# Patient Record
Sex: Male | Born: 1935 | Race: White | Hispanic: No | Marital: Married
Health system: Southern US, Community
[De-identification: ages and names within clinical notes are randomized; demographics above are authoritative.]

---

## 2011-06-16 ENCOUNTER — Ambulatory Visit: Payer: Self-pay | Admitting: Internal Medicine

## 2011-06-25 ENCOUNTER — Inpatient Hospital Stay: Payer: Self-pay | Admitting: Specialist

## 2011-06-25 LAB — COMPREHENSIVE METABOLIC PANEL
Albumin: 4.2 g/dL (ref 3.4–5.0)
Alkaline Phosphatase: 151 U/L — ABNORMAL HIGH (ref 50–136)
Anion Gap: 10 (ref 7–16)
BUN: 43 mg/dL — ABNORMAL HIGH (ref 7–18)
Calcium, Total: 8.9 mg/dL (ref 8.5–10.1)
Creatinine: 1.44 mg/dL — ABNORMAL HIGH (ref 0.60–1.30)
EGFR (African American): >60
Glucose: 120 mg/dL — ABNORMAL HIGH (ref 65–99)
Osmolality: 288 (ref 275–301)
Potassium: 4.1 mmol/L (ref 3.5–5.1)
SGOT(AST): 81 U/L — ABNORMAL HIGH (ref 15–37)
Sodium: 138 mmol/L (ref 136–145)

## 2011-06-25 LAB — APTT
Activated PTT: >160 secs (ref 23.6–35.9)
Activated PTT: 108.2 secs — ABNORMAL HIGH (ref 23.6–35.9)

## 2011-06-25 LAB — CK TOTAL AND CKMB (NOT AT ARMC)
CK, Total: 327 U/L — ABNORMAL HIGH (ref 35–232)
CK, Total: 554 U/L — ABNORMAL HIGH (ref 35–232)
CK-MB: 42.5 ng/mL — ABNORMAL HIGH (ref 0.5–3.6)

## 2011-06-25 LAB — CBC
HCT: 41.8 % (ref 40.0–52.0)
HGB: 14.2 g/dL (ref 13.0–18.0)
MCH: 30.7 pg (ref 26.0–34.0)
Platelet: 288 10*3/uL (ref 150–440)
RBC: 4.61 10*6/uL (ref 4.40–5.90)

## 2011-06-26 LAB — CBC WITH DIFFERENTIAL/PLATELET
Basophil #: 0 10*3/uL (ref 0.0–0.1)
Basophil %: 0 %
Eosinophil #: 0 10*3/uL (ref 0.0–0.7)
Lymphocyte #: 0.7 10*3/uL — ABNORMAL LOW (ref 1.0–3.6)
Lymphocyte %: 7.6 %
MCH: 30.1 pg (ref 26.0–34.0)
MCHC: 33.1 g/dL (ref 32.0–36.0)
MCV: 91 fL (ref 80–100)
Monocyte #: 0.1 x10 3/mm — ABNORMAL LOW (ref 0.2–1.0)
Neutrophil #: 8.9 10*3/uL — ABNORMAL HIGH (ref 1.4–6.5)
Neutrophil %: 91.4 %
RBC: 3.76 10*6/uL — ABNORMAL LOW (ref 4.40–5.90)
RDW: 14.1 % (ref 11.5–14.5)
WBC: 9.7 10*3/uL (ref 3.8–10.6)

## 2011-06-26 LAB — URINALYSIS, COMPLETE
Bilirubin,UR: NEGATIVE
Glucose,UR: NEGATIVE mg/dL (ref 0–75)
Hyaline Cast: 3
Ketone: NEGATIVE
Nitrite: NEGATIVE
Ph: 6 (ref 4.5–8.0)
Protein: 30
RBC,UR: 20 /HPF (ref 0–5)
Specific Gravity: 1.015 (ref 1.003–1.030)
WBC UR: 10 /HPF (ref 0–5)

## 2011-06-26 LAB — BASIC METABOLIC PANEL
BUN: 37 mg/dL — ABNORMAL HIGH (ref 7–18)
Chloride: 99 mmol/L (ref 98–107)
Co2: 30 mmol/L (ref 21–32)
EGFR (African American): >60
EGFR (Non-African Amer.): >60
Glucose: 150 mg/dL — ABNORMAL HIGH (ref 65–99)
Potassium: 3.6 mmol/L (ref 3.5–5.1)

## 2011-06-26 LAB — HEMOGLOBIN A1C: Hemoglobin A1C: 5.6 % (ref 4.2–6.3)

## 2011-06-26 LAB — CK TOTAL AND CKMB (NOT AT ARMC): CK-MB: 75.1 ng/mL — ABNORMAL HIGH (ref 0.5–3.6)

## 2011-06-26 LAB — APTT: Activated PTT: 97.8 secs — ABNORMAL HIGH (ref 23.6–35.9)

## 2011-06-27 LAB — APTT: Activated PTT: 154.8 secs — ABNORMAL HIGH (ref 23.6–35.9)

## 2011-06-29 LAB — BASIC METABOLIC PANEL
Calcium, Total: 8.2 mg/dL — ABNORMAL LOW (ref 8.5–10.1)
Chloride: 98 mmol/L (ref 98–107)
Co2: 36 mmol/L — ABNORMAL HIGH (ref 21–32)
EGFR (African American): >60
EGFR (Non-African Amer.): >60
Glucose: 176 mg/dL — ABNORMAL HIGH (ref 65–99)
Osmolality: 291 (ref 275–301)
Potassium: 3.8 mmol/L (ref 3.5–5.1)
Sodium: 141 mmol/L (ref 136–145)

## 2011-06-29 LAB — CBC WITH DIFFERENTIAL/PLATELET
Basophil #: 0 10*3/uL (ref 0.0–0.1)
Eosinophil #: 0 10*3/uL (ref 0.0–0.7)
Eosinophil %: 0 %
HCT: 32.7 % — ABNORMAL LOW (ref 40.0–52.0)
Lymphocyte %: 4.5 %
MCHC: 34.1 g/dL (ref 32.0–36.0)
RDW: 13.6 % (ref 11.5–14.5)
WBC: 7.7 10*3/uL (ref 3.8–10.6)

## 2011-06-30 LAB — BASIC METABOLIC PANEL
Calcium, Total: 8.3 mg/dL — ABNORMAL LOW (ref 8.5–10.1)
Creatinine: 0.96 mg/dL (ref 0.60–1.30)
Glucose: 166 mg/dL — ABNORMAL HIGH (ref 65–99)
Osmolality: 289 (ref 275–301)
Potassium: 4.5 mmol/L (ref 3.5–5.1)
Sodium: 140 mmol/L (ref 136–145)

## 2011-06-30 LAB — CBC WITH DIFFERENTIAL/PLATELET
Basophil #: 0 10*3/uL (ref 0.0–0.1)
Basophil %: 0.1 %
HCT: 34 % — ABNORMAL LOW (ref 40.0–52.0)
HGB: 11.7 g/dL — ABNORMAL LOW (ref 13.0–18.0)
MCH: 30.7 pg (ref 26.0–34.0)
MCHC: 34.4 g/dL (ref 32.0–36.0)
Neutrophil #: 8 10*3/uL — ABNORMAL HIGH (ref 1.4–6.5)
Platelet: 161 10*3/uL (ref 150–440)
RBC: 3.8 10*6/uL — ABNORMAL LOW (ref 4.40–5.90)
RDW: 13.7 % (ref 11.5–14.5)

## 2011-06-30 LAB — CULTURE, BLOOD (SINGLE)

## 2011-07-01 LAB — MAGNESIUM: Magnesium: 2.2 mg/dL

## 2011-07-02 LAB — BASIC METABOLIC PANEL
BUN: 33 mg/dL — ABNORMAL HIGH (ref 7–18)
Calcium, Total: 8.1 mg/dL — ABNORMAL LOW (ref 8.5–10.1)
Chloride: 99 mmol/L (ref 98–107)
Co2: 31 mmol/L (ref 21–32)
Creatinine: 1.06 mg/dL (ref 0.60–1.30)
EGFR (African American): >60

## 2011-07-16 ENCOUNTER — Ambulatory Visit: Payer: Self-pay | Admitting: Internal Medicine

## 2011-07-27 LAB — URINALYSIS, COMPLETE
Bacteria: NONE SEEN
Bilirubin,UR: NEGATIVE
Leukocyte Esterase: NEGATIVE
Nitrite: NEGATIVE
Ph: 5 (ref 4.5–8.0)
RBC,UR: 1 /HPF (ref 0–5)

## 2011-07-27 LAB — CBC
HGB: 11.9 g/dL — ABNORMAL LOW (ref 13.0–18.0)
MCV: 93 fL (ref 80–100)
Platelet: 253 10*3/uL (ref 150–440)
RDW: 15.5 % — ABNORMAL HIGH (ref 11.5–14.5)

## 2011-07-27 LAB — COMPREHENSIVE METABOLIC PANEL
Albumin: 3 g/dL — ABNORMAL LOW (ref 3.4–5.0)
Alkaline Phosphatase: 128 U/L (ref 50–136)
Anion Gap: 8 (ref 7–16)
Calcium, Total: 8.8 mg/dL (ref 8.5–10.1)
Chloride: 103 mmol/L (ref 98–107)
Creatinine: 1.88 mg/dL — ABNORMAL HIGH (ref 0.60–1.30)
EGFR (African American): 38 — ABNORMAL LOW
Osmolality: 294 (ref 275–301)
Potassium: 5 mmol/L (ref 3.5–5.1)
SGOT(AST): 84 U/L — ABNORMAL HIGH (ref 15–37)
Total Protein: 7 g/dL (ref 6.4–8.2)

## 2011-07-27 LAB — TROPONIN I
Troponin-I: <0.02 ng/mL
Troponin-I: <0.02 ng/mL

## 2011-07-27 LAB — ETHANOL
Ethanol %: <0.003 % (ref 0.000–0.080)
Ethanol: <3 mg/dL

## 2011-07-27 LAB — DRUG SCREEN, URINE
Barbiturates, Ur Screen: NEGATIVE (ref ?–200)
Benzodiazepine, Ur Scrn: NEGATIVE (ref ?–200)
Cocaine Metabolite,Ur ~~LOC~~: NEGATIVE (ref ?–300)
MDMA (Ecstasy)Ur Screen: NEGATIVE (ref ?–500)
Phencyclidine (PCP) Ur S: NEGATIVE (ref ?–25)

## 2011-07-27 LAB — CK TOTAL AND CKMB (NOT AT ARMC): CK, Total: 1270 U/L — ABNORMAL HIGH (ref 35–232)

## 2011-07-27 LAB — PROTIME-INR
INR: 1
Prothrombin Time: 13.7 secs (ref 11.5–14.7)

## 2011-07-28 ENCOUNTER — Inpatient Hospital Stay: Payer: Self-pay | Admitting: Specialist

## 2011-07-28 LAB — BASIC METABOLIC PANEL
Anion Gap: 6 — ABNORMAL LOW (ref 7–16)
Co2: 29 mmol/L (ref 21–32)
Creatinine: 1.39 mg/dL — ABNORMAL HIGH (ref 0.60–1.30)
EGFR (Non-African Amer.): 48 — ABNORMAL LOW
Glucose: 131 mg/dL — ABNORMAL HIGH (ref 65–99)

## 2011-07-28 LAB — CK TOTAL AND CKMB (NOT AT ARMC): CK, Total: 753 U/L — ABNORMAL HIGH (ref 35–232)

## 2011-07-28 LAB — TROPONIN I: Troponin-I: <0.02 ng/mL

## 2011-07-29 LAB — BASIC METABOLIC PANEL
Anion Gap: 7 (ref 7–16)
BUN: 30 mg/dL — ABNORMAL HIGH (ref 7–18)
Calcium, Total: 8.3 mg/dL — ABNORMAL LOW (ref 8.5–10.1)
Chloride: 108 mmol/L — ABNORMAL HIGH (ref 98–107)
Glucose: 100 mg/dL — ABNORMAL HIGH (ref 65–99)
Potassium: 4.2 mmol/L (ref 3.5–5.1)

## 2011-07-29 LAB — TSH: Thyroid Stimulating Horm: 2.17 u[IU]/mL

## 2011-07-29 LAB — MAGNESIUM: Magnesium: 1.7 mg/dL — ABNORMAL LOW

## 2011-07-29 LAB — CK-MB: CK-MB: 1.3 ng/mL (ref 0.5–3.6)

## 2011-07-29 LAB — CK: CK, Total: 247 U/L — ABNORMAL HIGH (ref 35–232)

## 2011-07-30 LAB — BASIC METABOLIC PANEL
Anion Gap: 6 — ABNORMAL LOW (ref 7–16)
Calcium, Total: 8.5 mg/dL (ref 8.5–10.1)
Chloride: 106 mmol/L (ref 98–107)
EGFR (Non-African Amer.): >60
Osmolality: 287 (ref 275–301)

## 2011-07-30 LAB — MAGNESIUM: Magnesium: 1.6 mg/dL — ABNORMAL LOW

## 2011-07-31 LAB — PLATELET COUNT: Platelet: 243 10*3/uL (ref 150–440)

## 2011-08-16 ENCOUNTER — Ambulatory Visit: Payer: Self-pay | Admitting: Internal Medicine

## 2011-10-16 ENCOUNTER — Ambulatory Visit: Payer: Self-pay | Admitting: Internal Medicine

## 2011-10-26 ENCOUNTER — Inpatient Hospital Stay: Payer: Self-pay | Admitting: Internal Medicine

## 2011-10-26 LAB — CK TOTAL AND CKMB (NOT AT ARMC)
CK, Total: 193 U/L (ref 35–232)
CK-MB: 1.9 ng/mL (ref 0.5–3.6)

## 2011-10-26 LAB — COMPREHENSIVE METABOLIC PANEL
Albumin: 2.6 g/dL — ABNORMAL LOW (ref 3.4–5.0)
Alkaline Phosphatase: 103 U/L (ref 50–136)
Anion Gap: 10 (ref 7–16)
BUN: 68 mg/dL — ABNORMAL HIGH (ref 7–18)
Calcium, Total: 8.3 mg/dL — ABNORMAL LOW (ref 8.5–10.1)
Chloride: 101 mmol/L (ref 98–107)
EGFR (Non-African Amer.): 14 — ABNORMAL LOW
Glucose: 126 mg/dL — ABNORMAL HIGH (ref 65–99)
Osmolality: 295 (ref 275–301)
Potassium: 6 mmol/L — ABNORMAL HIGH (ref 3.5–5.1)
SGOT(AST): 791 U/L — ABNORMAL HIGH (ref 15–37)
Total Protein: 6.7 g/dL (ref 6.4–8.2)

## 2011-10-26 LAB — CBC WITH DIFFERENTIAL/PLATELET
Basophil #: 0 10*3/uL (ref 0.0–0.1)
Basophil %: 0.1 %
Eosinophil %: 0 %
HCT: 26.5 % — ABNORMAL LOW (ref 40.0–52.0)
HGB: 8.9 g/dL — ABNORMAL LOW (ref 13.0–18.0)
Lymphocyte #: 0.5 10*3/uL — ABNORMAL LOW (ref 1.0–3.6)
Lymphocyte %: 3 %
MCH: 28.4 pg (ref 26.0–34.0)
MCHC: 33.8 g/dL (ref 32.0–36.0)
MCV: 84 fL (ref 80–100)
Monocyte #: 0.5 x10 3/mm (ref 0.2–1.0)
Neutrophil #: 16.3 10*3/uL — ABNORMAL HIGH (ref 1.4–6.5)
RBC: 3.15 10*6/uL — ABNORMAL LOW (ref 4.40–5.90)
RDW: 14.3 % (ref 11.5–14.5)
WBC: 17.3 10*3/uL — ABNORMAL HIGH (ref 3.8–10.6)

## 2011-10-26 LAB — BASIC METABOLIC PANEL
Anion Gap: 13 (ref 7–16)
Calcium, Total: 7.9 mg/dL — ABNORMAL LOW (ref 8.5–10.1)
Creatinine: 4.2 mg/dL — ABNORMAL HIGH (ref 0.60–1.30)
EGFR (African American): 14 — ABNORMAL LOW
EGFR (Non-African Amer.): 12 — ABNORMAL LOW
Glucose: 320 mg/dL — ABNORMAL HIGH (ref 65–99)
Potassium: 5 mmol/L (ref 3.5–5.1)
Sodium: 133 mmol/L — ABNORMAL LOW (ref 136–145)

## 2011-10-26 LAB — PROTIME-INR
INR: 1.2
Prothrombin Time: 15.6 secs — ABNORMAL HIGH (ref 11.5–14.7)

## 2011-10-26 LAB — URINALYSIS, COMPLETE
Bilirubin,UR: NEGATIVE
Glucose,UR: NEGATIVE mg/dL (ref 0–75)
Hyaline Cast: 53
Ketone: NEGATIVE
RBC,UR: 1 /HPF (ref 0–5)
Specific Gravity: 1.016 (ref 1.003–1.030)
Squamous Epithelial: 1
WBC UR: 18 /HPF (ref 0–5)

## 2011-10-26 LAB — CBC
HCT: 21.5 % — ABNORMAL LOW (ref 40.0–52.0)
HGB: 7 g/dL — ABNORMAL LOW (ref 13.0–18.0)
MCV: 84 fL (ref 80–100)
Platelet: 340 10*3/uL (ref 150–440)
RBC: 2.58 10*6/uL — ABNORMAL LOW (ref 4.40–5.90)
RDW: 14.5 % (ref 11.5–14.5)
WBC: 14.4 10*3/uL — ABNORMAL HIGH (ref 3.8–10.6)

## 2011-10-26 LAB — TROPONIN I
Troponin-I: <0.02 ng/mL
Troponin-I: <0.02 ng/mL

## 2011-10-27 LAB — COMPREHENSIVE METABOLIC PANEL
Alkaline Phosphatase: 124 U/L (ref 50–136)
Bilirubin,Total: 0.8 mg/dL (ref 0.2–1.0)
Creatinine: 3.96 mg/dL — ABNORMAL HIGH (ref 0.60–1.30)
EGFR (Non-African Amer.): 13 — ABNORMAL LOW
Glucose: 284 mg/dL — ABNORMAL HIGH (ref 65–99)
Potassium: 5.2 mmol/L — ABNORMAL HIGH (ref 3.5–5.1)
SGPT (ALT): 1121 U/L — ABNORMAL HIGH (ref 12–78)
Sodium: 133 mmol/L — ABNORMAL LOW (ref 136–145)
Total Protein: 6.4 g/dL (ref 6.4–8.2)

## 2011-10-27 LAB — CBC WITH DIFFERENTIAL/PLATELET
Basophil #: 0 10*3/uL (ref 0.0–0.1)
Eosinophil #: 0 10*3/uL (ref 0.0–0.7)
Eosinophil %: 0 %
HCT: 23.8 % — ABNORMAL LOW (ref 40.0–52.0)
Lymphocyte #: 0.5 10*3/uL — ABNORMAL LOW (ref 1.0–3.6)
MCHC: 33.9 g/dL (ref 32.0–36.0)
MCV: 82 fL (ref 80–100)
Monocyte %: 1.4 %
Neutrophil #: 19.5 10*3/uL — ABNORMAL HIGH (ref 1.4–6.5)
RDW: 14.5 % (ref 11.5–14.5)
WBC: 20.3 10*3/uL — ABNORMAL HIGH (ref 3.8–10.6)

## 2011-10-27 LAB — TROPONIN I: Troponin-I: 0.03 ng/mL

## 2011-10-27 LAB — BASIC METABOLIC PANEL
BUN: 103 mg/dL — ABNORMAL HIGH (ref 7–18)
Creatinine: 4.02 mg/dL — ABNORMAL HIGH (ref 0.60–1.30)
EGFR (Non-African Amer.): 13 — ABNORMAL LOW
Glucose: 209 mg/dL — ABNORMAL HIGH (ref 65–99)
Potassium: 5.1 mmol/L (ref 3.5–5.1)
Sodium: 133 mmol/L — ABNORMAL LOW (ref 136–145)

## 2011-10-27 LAB — CK TOTAL AND CKMB (NOT AT ARMC): CK, Total: 796 U/L — ABNORMAL HIGH (ref 35–232)

## 2011-10-28 LAB — COMPREHENSIVE METABOLIC PANEL
Albumin: 2.3 g/dL — ABNORMAL LOW (ref 3.4–5.0)
Alkaline Phosphatase: 95 U/L (ref 50–136)
BUN: 115 mg/dL — ABNORMAL HIGH (ref 7–18)
Bilirubin,Total: 0.5 mg/dL (ref 0.2–1.0)
Chloride: 97 mmol/L — ABNORMAL LOW (ref 98–107)
EGFR (African American): 18 — ABNORMAL LOW
Glucose: 204 mg/dL — ABNORMAL HIGH (ref 65–99)
SGOT(AST): 343 U/L — ABNORMAL HIGH (ref 15–37)
SGPT (ALT): 772 U/L — ABNORMAL HIGH (ref 12–78)
Total Protein: 5.5 g/dL — ABNORMAL LOW (ref 6.4–8.2)

## 2011-10-28 LAB — CBC WITH DIFFERENTIAL/PLATELET
Basophil %: 0.4 %
Eosinophil #: 0 10*3/uL (ref 0.0–0.7)
Eosinophil %: 0 %
HCT: 19.7 % — ABNORMAL LOW (ref 40.0–52.0)
HGB: 5.9 g/dL — ABNORMAL LOW (ref 13.0–18.0)
Lymphocyte #: 0.3 10*3/uL — ABNORMAL LOW (ref 1.0–3.6)
Lymphocyte %: 1.5 %
MCH: 24.8 pg — ABNORMAL LOW (ref 26.0–34.0)
MCHC: 30.1 g/dL — ABNORMAL LOW (ref 32.0–36.0)
MCV: 82 fL (ref 80–100)
Monocyte #: 0.3 x10 3/mm (ref 0.2–1.0)
Neutrophil #: 18.6 10*3/uL — ABNORMAL HIGH (ref 1.4–6.5)
Neutrophil %: 96.6 %

## 2011-10-28 LAB — PROTIME-INR: INR: 1.3

## 2011-10-28 LAB — CK: CK, Total: 195 U/L (ref 35–232)

## 2011-10-31 LAB — CULTURE, BLOOD (SINGLE)

## 2011-11-16 ENCOUNTER — Ambulatory Visit: Payer: Self-pay | Admitting: Internal Medicine

## 2011-11-16 DEATH — deceased

## 2013-11-02 IMAGING — RF DG BARIUM SWALLOW
8 series · 15 of 24 positions shown · non-contrast
Comparison: none

REASON FOR EXAM: Dysphagia, Reflux.
COMMENTS:

[Series 1: fluoro_barium 2fps_bw · 0.18mm/px · 2 of 4 frames shown (1 of 8)]
[frame 1/4]
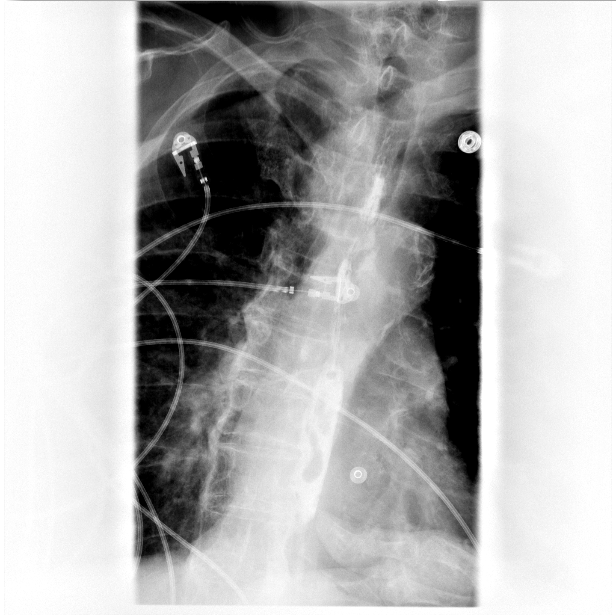
[frame 4/4]
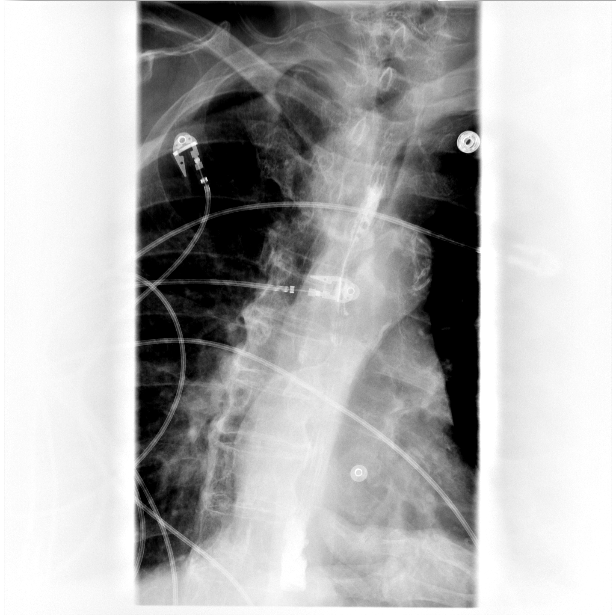

[Series 2: fluoro_barium 2fps_bw · 0.18mm/px · 2 of 12 frames shown (2 of 8)]
[frame 7/12]
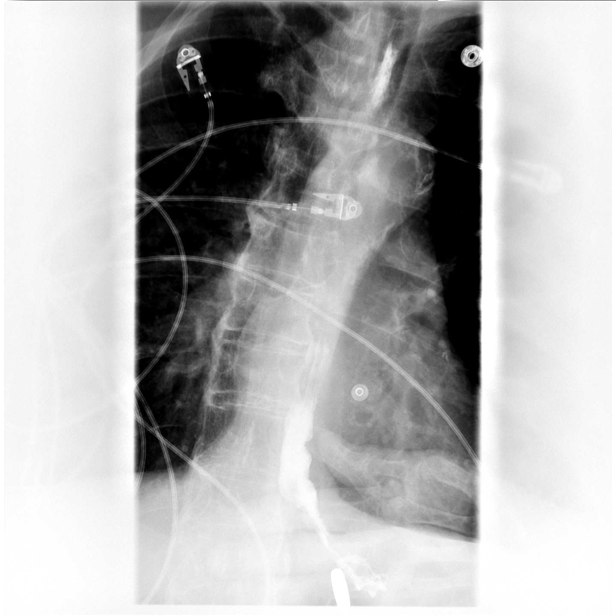
[frame 8/12]
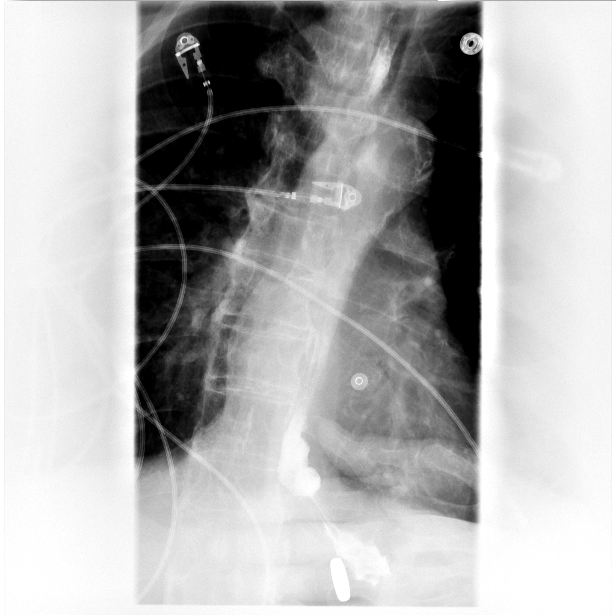

[Series 3: fluoro_barium 2fps_bw · 0.18mm/px · 2 of 20 frames shown (3 of 8)]
[frame 6/20]
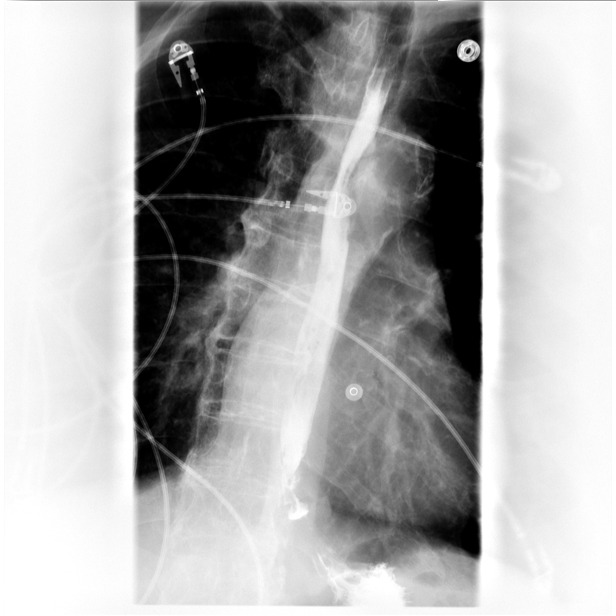
[frame 11/20]
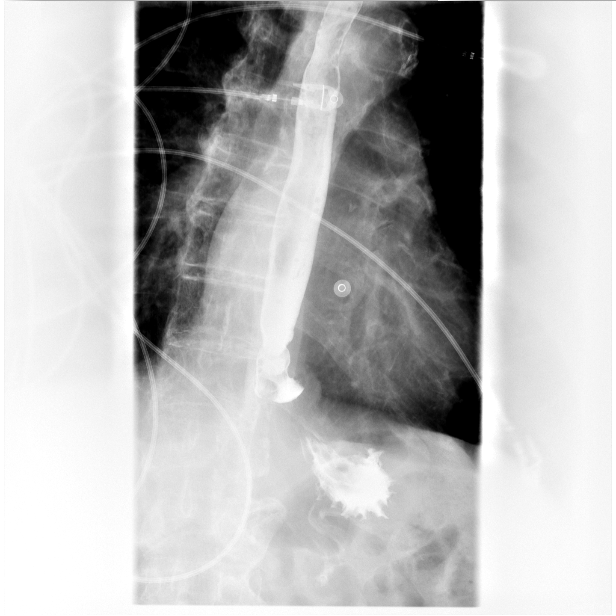

[Series 4: fluoro_barium 2fps_bw · 0.18mm/px · 2 of 5 frames shown (4 of 8)]
[frame 1/5]
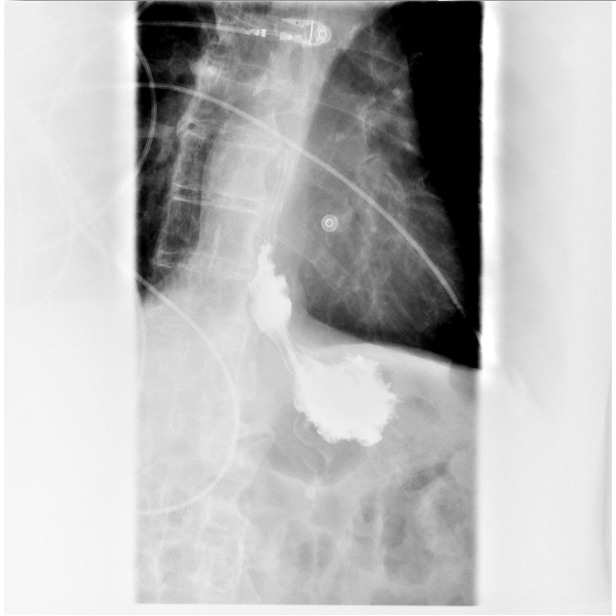
[frame 5/5]
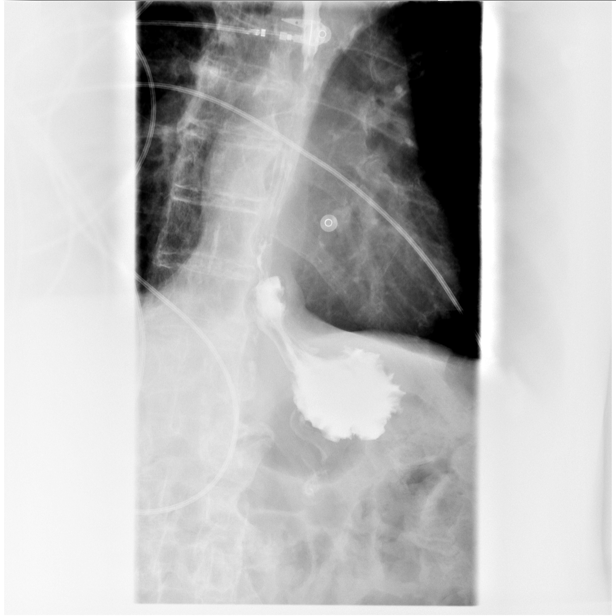

[Series 5: fluoro_barium 2fps_bw · 0.18mm/px · 3 of 19 frames shown (5 of 8)]
[frame 3/19]
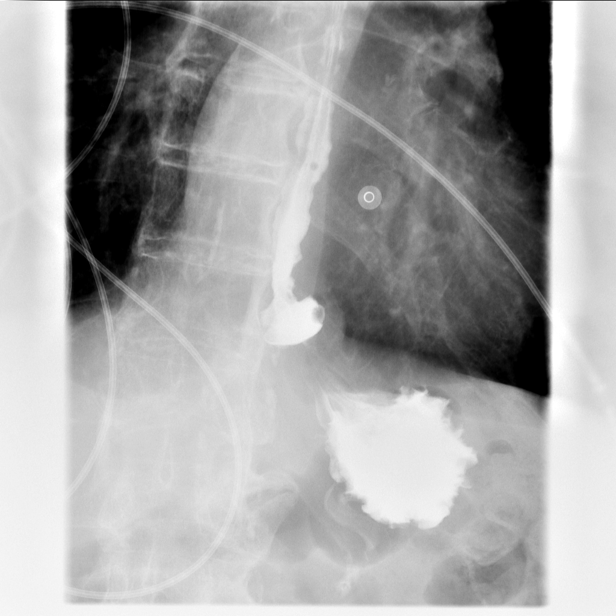
[frame 15/19]
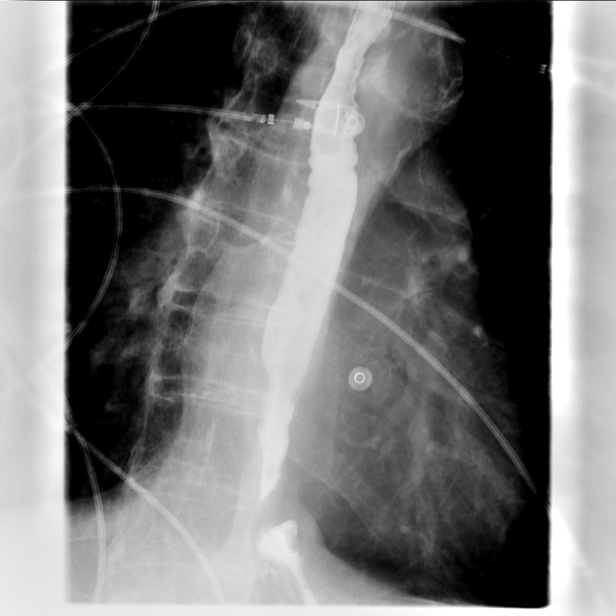
[frame 17/19]
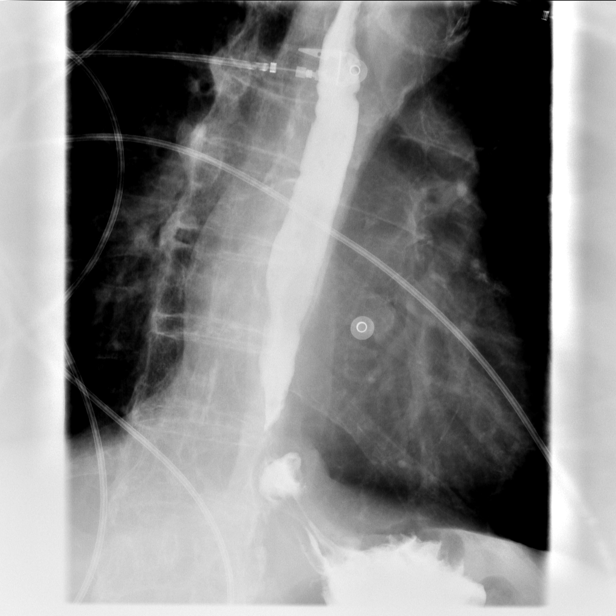

[Series 6: fluoro_barium 2fps_bw · 0.18mm/px · 1 of 10 frames shown (6 of 8)]
[frame 6/10]
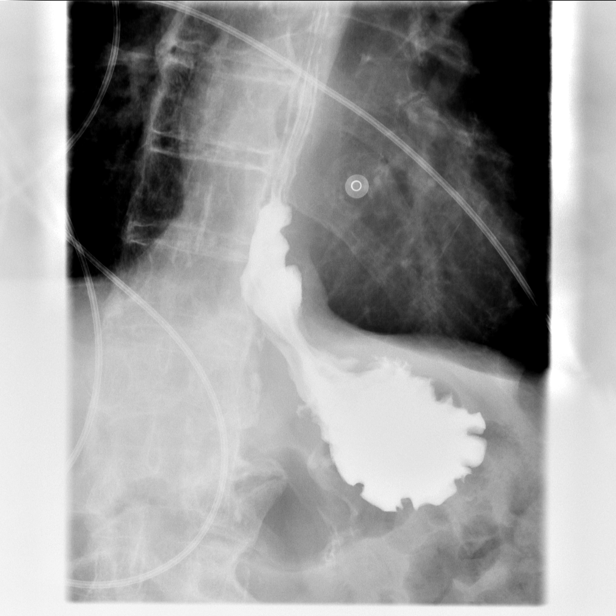

[Series 7: fluoro_barium 2fps_bw · 0.19mm/px · 2 of 2 frames shown (7 of 8)]
[frame 1/2]
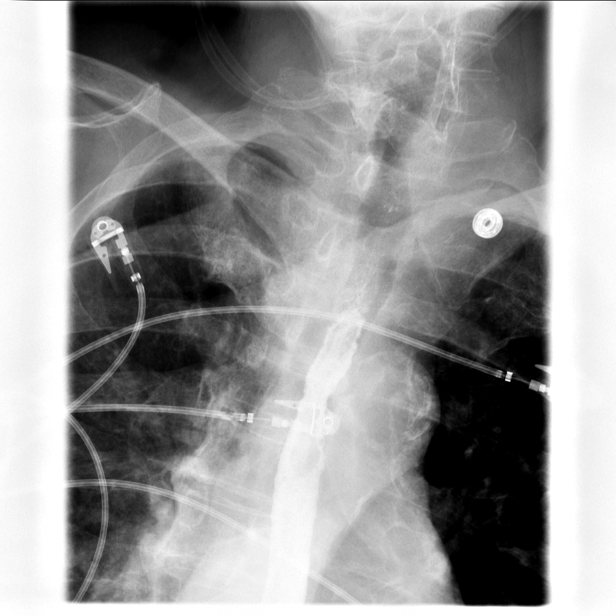
[frame 2/2]
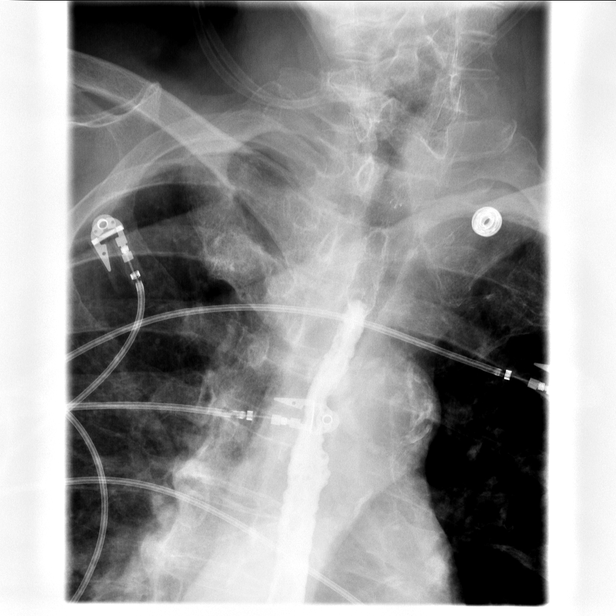

[Series 8: fluoro_barium 2fps_bw · 0.18mm/px · 1 of 2 frames shown (8 of 8)]
[frame 2/2]
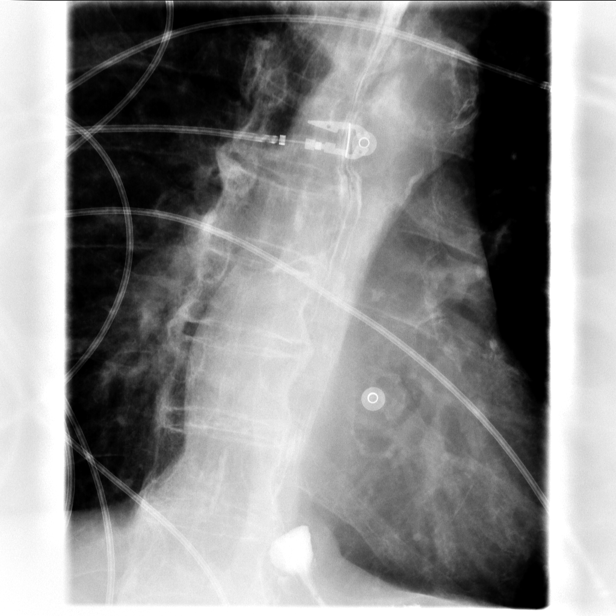

[15 of 24 positions shown; findings below may reference images not displayed]

PROCEDURE:     FL  - FL BARIUM SWALLOW  - July 01, 2011  [DATE]

RESULT:     Limited barium swallow examination is performed because of the
patient's limited mobility. A 12.5 mm barium-impregnated tablet was not
administered to the patient. The patient did ingest the liquid barium
without evidence of aspiration. There is no obstruction to passage of barium
into the stomach. Secondary and tertiary esophageal spasms are present. No
gross mucosal abnormality is evident.
IMPRESSION: Extremely limited exam given the patient's condition. No
obstruction to passage of liquids through the esophagus. No evidence of
perforation.

## 2014-07-04 NOTE — H&P (Signed)
PATIENT NAME:  Javier Henderson, Javier Henderson MR#:  [id number] DATE OF BIRTH:  05-02-1929  DATE OF ADMISSION:  10/26/2011  PRIMARY CARE PHYSICIAN: Unknown.   CHIEF COMPLAINT: Hypertension, shortness of breath, weakness.   HISTORY OF PRESENT ILLNESS: This is an 81 year old male with history of end-stage chronic obstructive pulmonary disease, recent non-Q-wave myocardial infarction, recent hip fracture, who was admitted in May and discharged to a skilled nursing facility for rehab from where he was returned home on Thursday of this week who presents to the ER because of hypotension, low oxygen saturation as well as dyspnea and weakness. The patient was on his hospice for his end-stage chronic obstructive pulmonary disease prior to being discharged to rehab. However, during his rehab the patient's hospice privileges were discontinued. The patient comes back in and is hypotensive, 81/36 with saturation of 75% on room air. He is confused and has altered mental status. He does not provide me with any history. His daughter who is present by the bedside states that the patient has not been able to take any of his home medications because he cannot afford them. They are in the process of applying for Medicaid. The patient has been feeling worse for the last four days. He has been complaining of dyspnea and shortness of breath. He is also found to be profoundly anemic at 7 which is different from his baseline of about 11. His guaiac is positive in the ER. The patient also has an elevated creatinine of greater than 3 today. He is found to be dyspneic with accessory muscle use and agonal respirations in the ED and has been placed on BiPAP support. At this time the daughter verifies that the patient does not want intubation, does not want chest compressions and is a LIMITED CODE.   REVIEW OF SYSTEMS: A complete review of systems could not be obtained because of the patient's clinical status and the fact that he is obtunded.    PAST MEDICAL HISTORY:  1. History of chronic obstructive pulmonary disease, on home oxygen. 2. History of ongoing tobacco abuse. 3. Recent non-Q wave myocardial infarction treated medically during admission in April 2013.  4. Hypertension.  5. Glaucoma.  6. History of SVT.  7. History of cardiomyopathy with an ejection fraction of 31% by echo in April 2013.   PAST SURGICAL HISTORY: None.   ALLERGIES: No known drug allergies.   FAMILY HISTORY: Hypertension in the patient's mother, died of cervical cancer. No other cancer in the family. No diabetes, stroke or coronary artery disease.   SOCIAL HISTORY: The patient is married with four children who live close by. He used to smoke 1 pack per day for more than sixty years, apparently quit eight years ago. He does not drink alcohol. He was recently discharged from rehab and currently lives with his wife.   HOME MEDICATIONS UPON DISCHARGE FROM THE NURSING HOME: 1. Aspirin 81 mg p.o. daily.  2. Vicodin 5/325, one to two tablets p.o. q.6 hours p.r.n.  3. Advair 250/50 Diskus 1 puff b.i.d.  4. DuoNebs q.6 p.r.n.  5. Spiriva 18 mcg inhaled daily.  6. Plavix 75 mg p.o. daily.  7. Norvasc 5 mg p.o. b.i.d.  8. Atorvastatin 40 mg p.o. daily.  9. Lisinopril 5 mg p.o. daily. 10. Metoprolol 25 mg p.o. daily.  11. Guaifenesin 5 milliliters p.o. every six hours p.r.n.  12. Tussionex 5 milliliters q.12 p.r.n.  13. MiraLAX 17 grams daily.  14. Bentyl ointment 30 grams apply topical to the wound.  15. Flomax 0.4 mg p.o. daily.   ALLERGIES: No known drug allergies.   PHYSICAL EXAMINATION:  VITAL SIGNS: Blood pressure 81/36, saturation of 75% on room air.   GENERAL: The patient is chronically ill-appearing, currently obtunded on BiPAP, moaning and groaning.   HEENT: Pupils are equal and reactive. Extraocular movements intact. Sclerae anicteric. No oropharyngeal erythema. Mucosa is dry.   NECK: Supple. No JVD. Thyroid is not enlarged. No  adenopathy.   LUNGS: Mildly decreased breath sounds with bibasilar wheezing and rhonchi at the bases labored inspirations on BiPAP. No dullness to percussion.   CARDIOVASCULAR: S1, S2. No murmurs, rubs, or gallops. PMI is nondisplaced. Chest is nontender to palpation.   EXTREMITIES: Without any cyanosis, clubbing, edema or tenderness.   ABDOMEN: Soft, nontender, nondistended. No epigastric tenderness. No hepatosplenomegaly. Normoactive bowel sounds.   RECTAL: Positive for stool guaiac.   MUSCULOSKELETAL: Able to move all four extremities spontaneously without any cyanosis or clubbing.   SKIN: Without any skin rash, erythema, induration or nodularity.   LYMPHATIC: No axillary, inguinal or cervical lymphadenopathy.   NEUROLOGIC: Cranial nerves unable to be tested as the patient is not cooperative at this time. Deep tendon reflexes 2+ bilaterally.   LABORATORY, DIAGNOSTIC AND RADIOLOGICAL DATA: pH 7.37, pCO2 37, pO2 234, INR 1.2. WBC 14.4, hemoglobin 7.0, hematocrit 21.5, glucose 126, BUN 68, creatinine 3.93, sodium 137, potassium 6.0, chloride 101, ALT 747, AST 791, total bilirubin 0.7.   ASSESSMENT AND PLAN:  1. Acute respiratory failure requiring BiPAP support, possibility in the setting of infection/pneumonia/possible aspiration. Start the patient on broad-spectrum antibiotics, BiPAP support and no intubation per family.  2. Anemia, likely secondary to upper gastrointestinal bleeding given elevated BUN. The patient is on aspirin and Plavix which will be held. Will start the patient on a Protonix drip. Transfuse the patient with blood. Likely this gastrointestinal bleeding is chronic and the patient is not actively bleeding. Therefore, hold off on the gastroenterology consultation for now as the patient is not stable for any kind of procedure.  3. Acute hepatitis likely in the setting of either congestive hepatomegaly versus secondary to hypotension and sepsis. Follow LFTs closely.   4. Acute kidney injury: Likely in the setting of dehydration and sepsis. Hydrate the patient aggressively with IV fluids.  5. Septic shock/hypovolemic shock. The patient is requiring dopamine support at this time despite that the patient is on maximum doses of dopamine and his systolic blood pressures are in the 80s. We will continue the dopamine and try to wean it off.  6. CODE STATUS: I have discussed with Ananias Pilgrimarol Smith, the patient's daughter, who is present by the bedside who agrees that the patient will be a poor candidate for long-term ventilator dependence and she does not her father to be placed on the vent or have chest compressions. She would, however, like to continue with BiPAP support as well as vasopressor support. She has verified this with her mother who is the power of attorney. At this time the patient is a LIMITED CODE.   CRITICAL CARE TIME SPENT: 1-1/2 hours.   ____________________________ Richarda OverlieNayana Rodriguez Aguinaldo, MD na:ap D: 10/26/2011 13:33:38 ET T: 10/26/2011 14:08:42 ET JOB#: [id number]  cc: Richarda OverlieNayana Josephine Rudnick, MD, <Dictator> Richarda OverlieNAYANA Glenora Morocho MD ELECTRONICALLY SIGNED 10/26/2011 15:04

## 2014-07-04 NOTE — Discharge Summary (Signed)
PATIENT NAME:  Javier Henderson, Javier Henderson MR#:  [id number] DATE OF BIRTH:  04-26-1929  DATE OF ADMISSION:  10/26/2011 DATE OF DISCHARGE:  10/29/2011  ADMITTING DIAGNOSIS: Sepsis.   DISCHARGE DIAGNOSES:  1. Systemic inflammatory response reaction due to bronchial pneumonia.  2. Septic shock.  3. Pleural effusion.  4. Acute renal failure.  5. Elevated transaminases.  6. Acute posthemorrhagic anemia.  7. Upper gastrointestinal bleed status post 3 units of packed red blood cell transfusion.   DISCHARGE CONDITION: Poor.   DISCHARGE MEDICATIONS: 1. Patient is to start Roxanol 5-10  mg q.1 hour as needed.  2. Clonazepam 0.5 to 1 mg p.o. or sublingually every 2 to 4 hours as needed.  3. ABHR suppository 1 per rectum every 4 to 6 hours as needed.  4. Advair Diskus 250/50, 1 puff twice daily.  5. Albuterol ipratropium inhalation solution 1 vial 4 times daily.  6. Spiriva 18 mcg inhalation daily.  7. Levaquin 500 mg p.o. daily for eight more days.  8. Protonix 40 mg p.o. twice daily.   ACTIVITY: Patient's activity will be as tolerated.   DIET: As tolerated. Medications may be crushed in liquids when appropriate. May change to rectal route if unable to swallow.   OXYGEN: 2 liters of oxygen through nasal cannula continuously.   DISCHARGE INSTRUCTIONS: Patient is to have Foley left in bladder for urinary incontinence and prevention of skin breakdown.   HISTORY OF PRESENT ILLNESS: Patient is an 81 year old Caucasian male with past medical history significant for history of chronic obstructive pulmonary disease, history of coronary artery disease, hip fracture who was admitted in May and discharged to skilled nursing facility for rehab presented to the Emergency Room because of hypertension, low oxygen saturation as well as dyspnea. Please refer to Dr. Antionette PolesAbrol's admission note on 10/26/2011. On arrival to the hospital he was obtunded. His vital signs revealed blood pressure 81/36, oxygen saturation was  75% on room air. Patient was obtunded on BiPAP moaning and groaning. His physical exam revealed mildly decreased breath sounds with bibasilar wheezing and rhonchi at bases, labored inspirations on BiPAP, otherwise unremarkable evaluation.   LABORATORY, DIAGNOSTIC AND RADIOLOGICAL DATA: Patient's lab data done on the day of admission, 10/26/2011, glucose 126, BUN 68, creatinine 3.93, potassium 6.0, bicarbonate level 26, estimated GFR for non-African American would be 14. Liver enzymes showed elevation of AST as well as ALT to 791 and 747 respectively. Albumin level was 2.6. Cardiac enzymes, first set, as well as subsequent two more sets, were within normal limits except of elevated CK levels close to 700. White blood cell count was elevated to 14.4, hemoglobin 7.0, platelet count 340. Patient's pro time 15.6, INR 1.2. Blood cultures x2 did not show any growth. Urinalysis was remarkable for 2+ leukocyte esterase, 18 white blood cells, 1+ bacteria. ABGs were done and they showed pH 7.37, pCO2 37, pO2 234, saturation 99.7% on 60% FiO2 on BiPAP. EKG showed junctional rhythm at 64 beats per minute, low voltage QRS, nonspecific T wave abnormalities. Chest x-ray done 10/26/2011 portable single view showed no acute pulmonary disease, borderline to mild cardiomegaly which may be somewhat accentuated by lordotic projection. Repeated chest x-ray 10/26/2011 after line placement revealed placement of left subclavian central venous catheter with tip in superior vena cava without evidence of complication as compared to previous image. Repeated chest x-ray 10/27/2011 showed stable cardiomegaly and left pleural effusion, atelectasis versus infiltrate. Patient had CT scan of his chest without contrast 10/27/2011 which revealed moderate size left pleural effusion  which has convex borders adjacent to atelectatic consolidated left lower lobe. There is tiny amount of pleural fluid layering posteriorly on the right. An empyema cannot be  dogmatically excluded. There is consolidation of much of right lower lobe consistent with pneumonia and/or atelectasis. Aerated portions of right lung in the left upper lobe exhibited no acute abnormalities but there are underlying emphysematous changes. Patchy density in posterior costophrenic gutter on the right adjacent to pleural effusion reflects atelectasis or pneumonia. There is moderate distention of stomach with gas and fluid. Nasogastric suction may be useful in an effort to avoid aspiration according to radiologist.  HOSPITAL COURSE: Patient was admitted the hospital to Intensive Care Unit. He was started on pressors and required two pressors to maintain his blood pressure. He was also given high rate IV fluids with hopes to improve his kidney function as well as improve his septic shock.  1. In regards systemic inflammatory response reaction, it was felt that patient's systemic inflammatory response reaction is due to bacterial pneumonia as well as possibly urinary tract infection. Bacterial pneumonia was mostly implicated. Patient had broad-spectrum antibiotics given with some improvement of his condition. He was able to be weaned off pressors completely. In regards to bacterial pneumonia, it was treated and his oxygenation remained stable. 2. For pleural effusions, attempt was made to tap, however, because of patient's grave condition decision was made not to interfere at this point, however, initially Lasix therapy IV which was done by Dr. Thedore Mins, nephrologist. Patient received Lasix with good urine output and improvement of his oxygenation as well his edematous status.  3. In regards to acute renal failure, it was felt to be likely ATN due to hypotension. Patient's kidney function was very poor on the day of admission with creatinine level of 3.93. It worsened as time progressed to 4.20 on 10/26/2011, somewhat improved by 10/28/2011 to 3.45. Despite somewhat improvement patient himself as well as  patient's family made decision to change CODE STATUS to DO NOT RESUSCITATE as well as patient's care to palliative care only and decided on hospice home where patient will be discharged today, 10/29/2011.  4. In regards to elevated transaminases it was unclear why patient had those abnormalities. Ultrasound of kidneys only was done on 10/27/2011 which was unremarkable except of bilateral pleural effusion but no ultrasound or CT scan of abdomen was performed. Despite that and with therapy patient's liver enzymes while initially worsened later seemed to be improving. On 10/28/2011 patient's AST was 343 and ALT was 772. It was felt that patient's abnormalities were possibly related to the same hypotension as her kidney function abnormalities.  5. Patient was severely anemic on arrival to Emergency Room. He was found to be bleeding from upper GI tract. Patient had NG tube placed which showed black and coffee ground liquid. Gastroccult positive. Patient's hemoglobin level trended down. Patient required transfusion right away on admission to the hospital with 2 units transfused on admission, later on patient's hemoglobin also dropped down and he was again transfused with one more unit of packed red blood cells. Despite all conservative measures patient's hemoglobin level was found to be 5.9 on 10/28/2011, however, no more transfusions performed as patient was comfort care only.  6. Patient is being discharged in stable condition with above-mentioned medications and follow up. Vital signs: Temperature 98, pulse is ranging from 70s to 80s, respiration rate 22 to 31, blood pressure 123/53 and oxygen saturation 95% on 2 liters of oxygen through nasal cannula at rest.  TIME SPENT: 40 minutes.  ____________________________ Katharina Caper, MD rv:cms D: 10/29/2011 21:39:18 ET T: 10/31/2011 10:03:20 ET JOB#: [id number]  cc: Katharina Caper, MD, <Dictator> Bertice Risse MD ELECTRONICALLY SIGNED 11/15/2011 15:13

## 2014-07-04 NOTE — Op Note (Signed)
PATIENT NAME:  Javier Henderson, Javier Henderson MR#:  [id number] DATE OF BIRTH:  02-11-1930  DATE OF PROCEDURE:  10/26/2011  PREOPERATIVE DIAGNOSIS: Sepsis.   POSTOPERATIVE DIAGNOSIS: Sepsis.   PROCEDURE PERFORMED: Left subclavian vein central venous catheter placement.   SURGEON: Claude MangesWilliam F Freddrick Gladson, M.D.   ANESTHESIA: Local.   PROCEDURE IN DETAIL: The patient was placed in Trendelenburg position and prepped and draped in the usual sterile fashion. Access to the left subclavian vein was gained with an 18-gauge thin wall needle and this was exchanged utilizing the double Seldinger technique for a triple lumen central venous catheter. The first placement of the guidewire went up the patient's left internal jugular vein as he complained of left ear pain and ultimately the second stick and replacement of the guidewire went well. The catheter showed a CVP of approximately 10 or 12 and then was flushed with heparinized saline and secured to the skin with 3-0 silk sutures and a sterile dressing was applied by the nurse. Portable chest x-ray is pending at the time of this dictation. The patient tolerated the procedure well. There were no complications.  ____________________________ Claude MangesWilliam F. Lashanna Angelo, MD wfm:cms D: 10/26/2011 22:21:54 ET T: 10/27/2011 10:32:16 ET JOB#: [id number]  cc: Claude MangesWilliam F. Braidan Ricciardi, MD, <Dictator>  Claude MangesWILLIAM F Iysha Mishkin MD ELECTRONICALLY SIGNED 10/31/2011 21:07

## 2014-07-04 NOTE — Consult Note (Signed)
Comments   I met with pt's wife and son. Updated them on pt's current medical condition. They realize that pt's prognosis is poor and that he is suffering. They say that pt would never want dialysis. They also realize that they cannot take care of pt at home and that he will have to go to SNF. I offered the option of the Hospice Home and family is in agreement with this.  family's request, I then spoke with pt about the above. He confirms that he does not want dialysis. He also knows that he cannot go back home. I talked with him about the Hospice Home and he is in agreement with this. Ginny Ward, RN, liason for the [facility] notified and will meet with family.   Electronic Signatures: Liliane Mallis, Izora Gala (MD)  (Signed 13-Aug-13 15:04)  Authored: Palliative Care   Last Updated: 13-Aug-13 15:04 by Rhian Asebedo, Izora Gala (MD)

## 2014-07-09 NOTE — H&P (Signed)
PATIENT NAME:  Javier Henderson, Javier Henderson MR#:  [id number] DATE OF BIRTH:  February 29, 1930  DATE OF ADMISSION:  07/27/2011  PRIMARY CARE PHYSICIAN: Unknown.   CHIEF COMPLAINT: Altered mental status, failure to thrive.   History is obtained from ER physician and old records. Family is not present in the ER and unavailable to discuss on the phone, left a message for daughter, Dorita Sciara.   HISTORY OF PRESENT ILLNESS: Javier Henderson is an 81 year old Caucasian gentleman who was recently admitted from April 10th to April 18th, was discharged to go home with home health. At that time he was admitted with acute on chronic COPD exacerbation and non-Q-wave MI that was treated medically. He comes to the Emergency Room with altered mental status. The family dropped him off and I'm unable to contact them at this time. The patient is a poor historian and he complains mainly of pain in both his lower extremities, more on the right hip. In the Emergency Room the patient has been hemodynamically stable. The patient, however, looks unkempt, appears very weak and quite lethargic. He did answer some of my questions, however, was unable to give any details. There is a history that the patient has been falling at home and he was found to have left flank hematoma. As part of the work-up on hematoma, the patient underwent abdominal CAT scan which showed right avulsion fracture of the greater trochanter of the proximal right femur. There is a 5 x 2 x 14 cm subcutaneous local fluid collection adjacent to the left external oblique presumably representing a chronic hematoma or seroma. The patient appeared to be dehydrated with mild rhabdomyolysis. He is being admitted for further evaluation and management.   PAST MEDICAL HISTORY: 1. Chronic obstructive pulmonary disease. The patient has been on oxygen at home.  2. Ongoing tobacco abuse.  3. Non-Q-wave MI, treated medically during last admission in April 2013.  4. Hypertension.   5. Glaucoma.  6. History of SVT.  7. Cardiomyopathy with EF of 35% by echo in April of 2013.   FAMILY HISTORY: The patient lives at home with his wife. He continues to smoke a pack a day for more than 60 years. He denies any alcohol use. He used to work as a Medical illustrator and in a mill. He was in [employer] for one term.   FAMILY HISTORY: Family history from old records includes hypertension in the patient's mother. Mother died of cervical cancer. No history of stroke or CAD.   REVIEW OF SYSTEMS: Unable to obtain much given the patient being a poor historian. All he complains of is pain in both hips.   MEDICATIONS: According to the patient's home medication list that was brought in:  1. Aspirin 81 mg daily.  2. Atorvastatin 40 mg at bedtime.  3. Lisinopril 40 mg daily.  4. Metoprolol 50 mg b.i.d.  5. Namenda 10 mg daily.  6. Nifedipine 90 mg daily.  7. Oxygen 2 liters per minute.  8. Plavix 75 mg daily.  9. Spiriva 18 mcg inhalation daily.  10. Symbicort 160/4.5 two puffs b.i.d.  11. Tramadol 50 mg 1 tablet 4 times a day as needed.   Please note at discharge from 07/03/2011 the patient was on Cardizem CD 180 mg daily, latanoprost eyedrops, Dorzolamide-timolol, and Brimonidine eyedrops which are not present on his medication list at present. We will need to verify the family regarding the eyedrops and Cardizem CD.   PHYSICAL EXAMINATION:   GENERAL: The patient is lethargic, appears  unkempt, disheveled, and appears very weak.   VITAL SIGNS: He is afebrile, pulse 92 to 98, respirations 24, blood pressure 140/62, sats are 99% on 2 liters nasal cannula.   HEENT: Atraumatic, normocephalic. Pupils equal, round, and reactive to light and accommodation. The patient has seborrheic dermatitis over his facial beard. Oral mucosa is dry.   NECK: Supple. No JVD. No carotid bruit.   RESPIRATORY: Clear to auscultation bilaterally. Decreased breath sounds in the bases.    CARDIOVASCULAR: Both the  heart sounds are normal. Rhythm is irregular. Rate is mildly tachycardic. No murmur heard. PMI not lateralized. Chest nontender.   EXTREMITIES: Feeble pedal pulses. Good femoral pulses. No lower extremity edema. The patient has both his legs rotated towards his left. He has painful range of motion at the right hip.   ABDOMEN: Soft. There is a large hematoma present on the left flank which appears to be a chronic dark maroon color indicating it being old hematoma. Good bowel sounds.   NEUROLOGICAL: Difficult at present given the patient's mental status. He moves his extremities, however, has a lot of pain moving the right lower extremity. Otherwise, no focal obvious neuro deficit noted at present.   SKIN: Warm and dry. The patient does have dry skin along with significant seborrheic dermatitis on his facial beard.   LABORATORY, DIAGNOSTIC, AND RADIOLOGICAL DATA: PT-INR within normal limits. Chest x-ray COPD. Pelvis AP shows no acute bony abnormality. CT of the head without contrast no acute intracranial abnormality, prominent chronic small vessel ischemic disease. Urinalysis negative for urinary tract infection. Urine drug screen is negative. White count 8.2, hemoglobin and hematocrit 11.9 and 34.9, platelet count 253, MCV 93, glucose 120, BUN 72, creatinine 1.88. Rest of the electrolytes normal. SGOT 84. Albumin 3.0. CPK 1270. CPK-MB 9.1. Troponin less than 0.02. Serum ethanol is less than 0.003. ABG within normal limits.   ASSESSMENT: 81 year old Javier Henderson with:   1. Adult failure to thrive. The patient appears unkempt, weak, unable to care for self.  2. Frequent falls at home with CT of abdomen revealing right avulsion fracture of the proximal femur.  3. Dehydration/mild rhabdomyolysis.  4. Left flank hematoma suspected from fall, appears chronic with 5 cm x 2 cm x 14 cm subcutaneous local fluid collection just under the left external oblique representing a chronic hematoma or seroma.   5. Cardiomyopathy with EF of 35%. The patient does not appear to be in heart failure.  6. Chronic obstructive pulmonary disease with tobacco abuse, on chronic home oxygen.    PLAN:  1. Admit patient to medical floor.  2. Will keep patient FULL CODE for now. Will need to readdress CODE STATUS with the family and the patient once patient is more awake and alert.  3. Continue all home medications.  4. SVNs around-the-clock as needed.  5. IV fluids for hydration.  6. Will have Palliative Care consultation revisit given the patient's current presentation.  7. Physical therapy for gait ambulation and training given history of falls at home.  8. Cycle cardiac enzymes x3.  9. Will need to recheck medication list with the patient's family at home. If he is still on his glaucoma eyedrops, will need to resume while he is in-house.  10. Care Management for discharge planning.  11. Further work-up according to the patient's clinical course. I did leave a message for the patient's daughter, Dorita Sciara, to call me back. I was unable to contact the patient's wife at this time. Will attempt again.  TIME SPENT: 50 minutes.   ____________________________ Wylie HailSona A. Allena KatzPatel, MD sap:drc D: 07/27/2011 19:15:46 ET T: 07/28/2011 05:49:22 ET JOB#: [id number]  cc: Farha Dano A. Allena KatzPatel, MD, <Dictator> Willow OraSONA A Caryl Manas MD ELECTRONICALLY SIGNED 07/28/2011 9:42

## 2014-07-09 NOTE — Consult Note (Signed)
Brief Consult Note: Diagnosis: dehydration/rhabdo/afib.   Patient was seen by consultant.   Recommend further assessment or treatment.   Comments: pt who is difficult historian admitted with alterred mental status and probable rhabdo.  new onset afib .  not a candidate for chronic antocoagulation.  will control rate and follow.  full note to follow.  Electronic Signatures: Dalia HeadingFath, Zubayr Bednarczyk A (MD)  (Signed 14-May-13 21:49)  Authored: Brief Consult Note   Last Updated: 14-May-13 21:49 by Dalia HeadingFath, Josanne Boerema A (MD)

## 2014-07-09 NOTE — Discharge Summary (Signed)
PATIENT NAME:  Javier Henderson, Javier Henderson MR#:  [id number] DATE OF BIRTH:  1929-09-27  DATE OF ADMISSION:  06/25/2011 DATE OF DISCHARGE:  07/03/2011  For a detailed note, please take a look at the history and physical done by Dr. Katharina Caper on admission.   DISCHARGE DIAGNOSES:  1. Acute respiratory failure secondary to chronic obstructive pulmonary disease exacerbation. 2. Non-ST Q wave myocardial, treated medically. 3. Hypertension.  4. Glaucoma.  5. Acute renal failure.  6. Medical noncompliance. 7. Hypertensive urgency.   DIET: The patient is being discharged on a low sodium, low-fat diet. Strict aspiration precautions. Medications in puree if trouble swallowing. The patient is to sit fully upright with all p.o. intake.   ACTIVITY: As tolerated.   DISCHARGE FOLLOWUP: Followup with Dr. Wallene Huh at [facility] care in [place] on 07/09/2011 at 10:00 a.m.   DISCHARGE MEDICATIONS:  1. Nifedipine 90 mg daily.  2. Latanoprost eyedrops one drop to each eye daily at bedtime. 3. Dorzolamide timolol one drop to each eye twice a day. 4. Brimonidine eyedrops 0.2% one drop twice a day. 5. Symbicort 2 puffs twice a day. 6. Spiriva 1 puff daily.  7. Prednisone taper starting at 60 mg down to 10 mg over the next six days.  8. Protonix 40 mg twice a day. 9. Cardizem CD 180 mg daily.  10. Metoprolol tartrate 50 mg twice a day. 11. Lisinopril 40 mg daily.  12. Aspirin 81 mg daily.  13. MiraLax 17 grams daily.  14. Oxygen 2 liters nasal cannula continuously.  CONSULTANTS:   1. Erin Fulling, MD - Pulmonary Critical Care.  2. Arnoldo Hooker, MD - Cardiology.  3. Ned Grace, MD - Palliative Care.   PERTINENT STUDIES: Chest x-ray done on admission showed no acute cardiopulmonary disease.   CT scan of the chest done with contrast showed no CT evidence of pulmonary embolism, very small bilateral effusions, and emphysematous changes.   A two-dimensional echocardiogram done showed moderate to global  hypokinesis of the left ventricle, ejection fraction of 35%, left atrium moderately dilated, right atrium mild to moderately dilated, moderate to severe mitral regurgitation, moderate tricuspid regurgitation, and pulmonary hypertension noted.   HOSPITAL COURSE: This is an 81 year old male with multiple medical problems as mentioned above who presented to the hospital on 06/25/2011 due to shortness of breath and cough and noted to be in respiratory failure.  1. Acute hypoxic hypercarbic respiratory failure. This was likely secondary to chronic obstructive pulmonary disease exacerbation from his ongoing tobacco abuse. The patient was admitted to the hospital and to the Intensive Care Unit and placed on BiPAP, started on aggressive therapy with IV steroids, around-the-clock nebulizer treatments, also with Symbicort and Spiriva, and empiric antibiotics with ceftriaxone and Zithromax. The patient did require intermittent BiPAP throughout the hospital stay for a few days. But since hospitalization his clinical symptoms have significantly improved. He is no longer as bronchospastic. He was ambulated on room air and did desaturate and qualified for home oxygen, which has been arranged for him. He underwent a CT of his chest, which showed moderate emphysema but no acute infiltrates. Presently, the patient now is being discharged on a prednisone taper along with Symbicort, Spiriva, and home oxygen as stated.  2. Chronic obstructive pulmonary disease exacerbation. This is likely secondary to underlying bronchitis and ongoing tobacco abuse. As mentioned above, the patient did require some BiPAP therapy during the hospital course, but was also treated aggressively with IV steroids, nebulizer treatments, maintenance inhalers, and empiric antibiotics and presently  is being discharged on prednisone, Symbicort, and Spiriva as stated along with home oxygen.  3. Non-Q wave myocardial infarction. When the patient presented to the  hospital, the patient was noted to have an elevated troponin at 2 and peaked as high as 18. The patient was seen in consultation by Dr. Arnoldo HookerBruce Kowalski from cardiology who did not think that the patient's troponin elevation was related to acute coronary syndrome but likely demand ischemia from severe chronic obstructive pulmonary disease. Therefore, the patient did not go for any acute cardiac intervention. His echocardiogram did show LV dysfunction with ejection fraction of 35%. He was managed medically with aspirin, beta blockers, ACE inhibitors, and statin. The patient currently is chest pain free and hemodynamically stable therefore currently he is being discharged on maintenance medications including aspirin, beta blockers, and ACE inhibitors as stated.  4. Supraventricular tachycardia. The patient did develop heart rates as high as in the 150s to 170s. This was likely related to his underlying pulmonary process. The patient's heart rate did improve with some rate control with beta blockers and Cardizem and he will resume those upon discharge.  5. Acute renal failure. This was secondary to ATN. It improved with some IV fluids and creatinine is now down to baseline presently.  6. Hypertensive urgency. The patient had significantly elevated blood pressures throughout the hospital course. Prior to coming to the hospital, he was taking only nifedipine and hydrochlorothiazide. Given his renal failure, I have discontinued his hydrochlorothiazide for now. He has been on multiple medications during the hospital course including Cardizem, metoprolol, lisinopril, and hydralazine with some improvement in his blood pressure control. Presently, he is being discharged on a regimen of nifedipine, Cardizem, metoprolol, and lisinopril and further titrations can be done by his primary care physician as an outpatient.  7. Constipation. The patient did receive a Dulcolax suppository and MiraLax, but presently he is being  discharged on MiraLax. He did have a bowel movement in the past two days and currently denies any abdominal pain.  8. Glaucoma. The patient prior to coming to the hospital was already on latanoprost, dorzolamide, and Brimonidine eyedrops. He will resume those upon discharge.   CODE STATUS: The patient is a LIMITED CODE with a DO NOT INTUBATE.          He is currently being discharged home with his family taking care of him, and he will follow up as a new patient with [facility] [place].   TIME SPENT: 45 minutes.    ____________________________ Rolly PancakeVivek J. Cherlynn KaiserSainani, MD vjs:slb D: 07/03/2011 16:28:42 ET T: 07/04/2011 12:22:00 ET JOB#: [id number]  cc: Rolly PancakeVivek J. Cherlynn KaiserSainani, MD, <Dictator> Dr. Wallene Huharew - [facility] [place] SirenVIVEK J SAINANI MD ELECTRONICALLY SIGNED 07/08/2011 12:19

## 2014-07-09 NOTE — Consult Note (Signed)
PATIENT NAME:  Javier Henderson, Javier Henderson MR#:  [id number] DATE OF BIRTH:  1929/05/17  DATE OF CONSULTATION:  07/28/2011  REFERRING PHYSICIAN:   CONSULTING PHYSICIAN:  Leitha SchullerMichael J. Jeniyah Menor, MD  REASON FOR CONSULTATION: Right hip fracture.   HISTORY OF PRESENT ILLNESS: The patient is an 81 year old male minimal, minimal hospital ambulator, who walks with the aid of a walker. He has had apparently long-term problems with medical problems and has had difficulty ambulating for some time. He complained about right hip pain and subsequent x-rays and CT were obtained showing right trochanteric fracture. He also had some swelling on the left side that is not related.   PHYSICAL EXAMINATION: On exam he holds the right leg in a flexed position at the hip and knee. He has slight pain with logrolling. He is quite tender over the greater trochanter. The skin is intact.   RADIOLOGICAL DATA: X-rays reveal a nondisplaced fracture of the greater trochanter.   CLINICAL IMPRESSION: Greater trochanteric fracture with minimal displacement.  RECOMMENDATIONS: Physical therapy. Weight-bearing as tolerated. Only restriction is avoiding resisted abduction. I discussed with the family that this is a non-operative issue.  ____________________________ Leitha SchullerMichael J. [place] Skalicky, MD mjm:slb D: 07/28/2011 12:50:47 ET T: 07/28/2011 16:55:39 ET JOB#: [id number]  cc: Leitha SchullerMichael J. Rhyker Silversmith, MD, <Dictator> Leitha SchullerMICHAEL J Tasnia Spegal MD ELECTRONICALLY SIGNED 07/29/2011 6:54

## 2014-07-09 NOTE — Consult Note (Signed)
General Aspect This is an 81 year old male that was admitted to [facility] for severe respiratory failure / COPD exacerbation. The son states he saw him a few days ago when he was in his usual health.  When he went by the patient's house his morning he was having extreme difficulty breathing.He does have a remote smoking history. He had some mild chest discomfort associated with his respiratory distress.  He did have positive troponins.  EKG is negative for acute MI.  He also has some renal insufficiency.  Patient currently is wearing BiPAP.  He is very hard of hearing .  The history comes from the son and the daughter.  They both deny any history of the father having coronary disease.  Currently, he is breathing much better without complaints of chest pain.   Physical Exam:   GEN well developed, no acute distress    HEENT pink conjunctivae, patient hard of hearing    RESP postive use of accessory muscles  [place] and    CARD Irregular rate and rhythm  Normal, S1, S2  premature contractions    ABD denies tenderness  normal BS    EXTR negative edema    SKIN skin turgor decreased    NEURO cranial nerves intact, motor/sensory function intact    PSYCH alert   Review of Systems:   Subjective/Chief Complaint Dyspnea    ENT: hard of hearing    Respiratory: Short of breath  Wheezing    Cardiovascular: No Complaints    Musculoskeletal: complaining of chronic pain, right knee    Review of Systems: All other systems were reviewed and found to be negative    Medications/Allergies Reviewed Medications/Allergies reviewed   Routine Chem:  10-Apr-13 07:34    B-Type Natriuretic Peptide [facility]) 1320  Cardiac:  10-Apr-13 07:34    CK, Total 327   CPK-MB, Serum 42.5  Routine Hem:  10-Apr-13 07:34    WBC (CBC) 26.5   RBC (CBC) 4.61   Hemoglobin (CBC) 14.2   Hematocrit (CBC) 41.8   Platelet Count (CBC) 288   MCV 91   MCH 30.7   MCHC 33.9   RDW 13.9  Routine Chem:  10-Apr-13 07:34     Glucose, Serum 120   BUN 43   Creatinine (comp) 1.44   Sodium, Serum 138   Potassium, Serum 4.1   Chloride, Serum 95   CO2, Serum 33   Calcium (Total), Serum 8.9  Hepatic:  10-Apr-13 07:34    Bilirubin, Total 0.4   Alkaline Phosphatase 151   SGPT (ALT) 44   SGOT (AST) 81   Total Protein, Serum 8.5   Albumin, Serum 4.2  Routine Chem:  10-Apr-13 07:34    Osmolality (calc) 288   eGFR (African American) >60   eGFR (Non-African American) 50   Anion Gap 10  Cardiac:  10-Apr-13 07:34    Troponin I 2.10    No Known Allergies:   Vital Signs/Nurse's Notes: **Vital Signs.:   10-Apr-13 14:00   Vital Signs Type Admission   Temperature Source axillary   Pulse Pulse 80   Respirations Respirations 28   Systolic BP Systolic BP 923   Diastolic BP (mmHg) Diastolic BP (mmHg) 80   Mean BP 105   Pulse Ox % Pulse Ox % 100   Pulse Ox Heart Rate 82     Impression 81 year old man with COPD exacerbation and elevated troponin thought secondary to demand ischemia from respiratory failure, COPD exacerbation/renal insufficiency and thought not to be acute  coronary syndrome.    Plan 1. Continue current plan of care.  Surface echocardiogram is pending.  Further recommendations per Dr. Nehemiah Massed after results of test are known.   Electronic Signatures: Roderic Palau (NP)  (Signed 10-Apr-13 15:50)  Authored: General Aspect/Present Illness, History and Physical Exam, Review of System, Labs, Allergies, Vital Signs/Nurse's Notes, Impression/Plan   Last Updated: 10-Apr-13 15:50 by Roderic Palau (NP)

## 2014-07-09 NOTE — Consult Note (Signed)
Brief Consult Note: Diagnosis: right greater trochanteric hip fracture.   Patient was seen by consultant.   Consult note dictated.   Recommend further assessment or treatment.   Orders entered.   Discussed with Attending MD.   Comments: only limitation is avoiding resisted abduction.  Electronic Signatures: Leitha SchullerMenz, Maverick Dieudonne J (MD)  (Signed 13-May-13 12:48)  Authored: Brief Consult Note   Last Updated: 13-May-13 12:48 by Leitha SchullerMenz, Saanvi Hakala J (MD)

## 2014-07-09 NOTE — Discharge Summary (Signed)
PATIENT NAME:  Javier Henderson, Javier Henderson MR#:  [id number] DATE OF BIRTH:  09/02/1929  DATE OF ADMISSION:  07/27/2011 DATE OF DISCHARGE:  07/31/2011  HISTORY AND PHYSICAL: For a detailed note, please take a look at the History and Physical done by Dr. Enedina Finner on admission.   DISCHARGE DIAGNOSES:  1. Supraventricular tachycardia likely resulting from atrial fibrillation/flutter. much improved.  2. Acute renal failure, now resolved. 3. Rhabdomyolysis secondary to a fall, also improved. 4. Status post fall and right hip fracture which is nonoperable, now requiring physical therapy.  5. Chronic obstructive pulmonary disease, oxygen dependent.  6. Hyperlipidemia.  7. History of congestive heart failure likely secondary to chronic systolic dysfunction.   DIET: The patient is being discharged on a low-sodium, low-fat diet.   ACTIVITY: As tolerated.   DISPOSITION: The patient is being discharged to a skilled nursing facility for ongoing physical therapy.   FOLLOWUP: Followup is in 1 to 2 weeks with the primary care physician at the facility.   DISCHARGE MEDICATIONS:  1. Symbicort 160/4.5, 2 puffs b.i.d.  2. Spiriva 1 puff daily.  3. Metoprolol tartrate 50 mg b.i.d.  4. Aspirin 81 mg daily.  5. MiraLax daily.  6. Oxygen 2 liters nasal cannula. 7. Atorvastatin 40 mg daily.  8. Namenda 10 mg daily.  9. Plavix 25 mg daily.  10. Tramadol 50 mg q.i.d. as needed.  11. Amiodarone 200 mg daily.  12. Cardizem CD 180 mg daily.  13. Norco 5/325, 1to 2 tabs p.o. every 6 hours p.r.n. pain.  14. Lisinopril 5 mg daily.   HOSPITAL CONSULTANTS:   1. Dr. Kennedy Bucker from Orthopedics.  2. Dr. Mariel Kansky from Cardiology. 3. Dr. Harriett Sine Phifer from Palliative Care.   PERTINENT HOSPITAL DIAGNOSTIC AND RADIOLOGICAL DATA:  A CT scan of the head done without contrast on admission showing no acute intracranial abnormality with prominent chronic small vessel ischemic disease.  A chest x-ray done on admission showing  chronic obstructive pulmonary disease. A pelvis x-ray done on May 12th showing no bony abnormality.  A CT scan of the abdomen and pelvis done also on admission showing subcutaneous increased density over the left flank consistent with a hematoma. Clinically no ecchymotic change. Fracture involving the greater trochanter of the right femur. Small pleural effusion,   HOSPITAL COURSE: The patient is an 81 year old male with medical problems as mentioned above, presented to the hospital on 07/27/2011 after suffering a fall and was noted to have a fracture of the right femur along with some renal failure and rhabdomyolysis.   1. Acute renal failure: The patient presented to the hospital dehydrated and in acute renal failure with a creatinine as high as 1.8. He was also noted to be in rhabdomyolysis with CKs in the 1200 range. The patient was aggressively hydrated with IV fluids. His ACE inhibitors and diuretics were held. His renal function since then has normalized and is at baseline presently.  2. Rhabdomyolysis: This was likely secondary to the fall and a femur fracture. The patient's CKs have now normalized with IV fluids and improved.  3. History of supraventricular tachycardia: The patient has a history of atrial fibrillation/flutter. He had significantly uncontrolled heart rates while he was in the hospital, although after receiving increasing rate-controlling medications, including some pulse doses of IV Cardizem and p.o. metoprolol and Cardizem, his heart rate has improved. His current regimen is amiodarone 200 mg daily, Metoprolol 50 mg b.i.d., Cardizem CD 180 mg daily with good rate control. The patient is not  a candidate for long-term anticoagulation as he is a high fall risk. For now, he will continue aspirin and Plavix.  4. History of congestive heart failure: The patient does have a history of underlying cardiomyopathy with ejection fraction of  35%. Clinically, he is presently not in heart  failure and did not require any diuretics while he was in the hospital. He will continue his maintenance medications, including beta blockers, ACE inhibitors as stated.  5. Chronic obstructive pulmonary disease: The patient is already oxygen dependent. He had no evidence of acute chronic obstructive pulmonary disease exacerbation. He will continue his Symbicort, Spiriva, and p.r.n. nebulizer treatments.  6. Hyperlipidemia: The patient was maintained on his atorvastatin. He will resume that upon discharge.  7. Dementia: The patient was maintained on his Namenda, and he will resume that upon discharge too.   8. Right hip fracture: The patient after suffering a fall had a right greater trochanteric fracture. The patient was seen by Dr. Kennedy BuckerMichael Menz from Orthopedics, who recommended this fracture is really nonoperable. The only limitation is to avoid resisted abduction, and therefore he requires aggressive physical therapy. He is, therefore, being discharged to a skilled nursing facility to get further physical therapy.   CODE STATUS:  The patient is a DO NOT INTUBATE, DO NOT RESUSCITATE.   TIME SPENT WITH DISCHARGE: 40 minutes.  ____________________________ Rolly PancakeVivek J. Cherlynn KaiserSainani, MD vjs:cbb D: 07/31/2011 10:29:31 ET T: 07/31/2011 10:39:44 ET JOB#: [id number]  cc: Rolly PancakeVivek J. Cherlynn KaiserSainani, MD, <Dictator> [place] SirenVIVEK J SAINANI MD ELECTRONICALLY SIGNED 07/31/2011 15:42

## 2014-07-09 NOTE — H&P (Signed)
PATIENT NAME:  Javier Henderson, Javier MR#:  [id number] DATE OF BIRTH:  1930/03/05  DATE OF ADMISSION:  06/25/2011  [facility]  HISTORY OF PRESENT ILLNESS: The patient is an 81 year old Caucasian male with past medical history significant for history of emphysema, chronic obstructive pulmonary disease, history of former tobacco abuse, hypertension, and chronic back pain who presented to the hospital with complaints of shortness of breath. Apparently the patient was doing well up until approximately two days ago when he started having increasing shortness of breath as well as increase in wheezing. He has been coughing however producing only clear phlegm. He has no fevers, however, he admits of chills. He has been using nebulizers every 10 to 15 minutes with no significant help. Overnight today he became so severely short of breath that when the patient's son came to the house EMS was called. On EMS arrival, the patient's oxygenation was noted to be 56% on room air. He was blue in color. He also had a few apnea episodes. He was very diaphoretic on arrival to the hospital as well. He was complaining of some chest discomfort. He was placed on CPAP and brought to the hospital with oxygen saturations around 100%.  the patient's lung sounds were severely diminished initially on EMS arrival, however, they improved on arrival to the emergency room. The patient was alert, responsive and calm in the emergency room. His lab studies revealed renal insufficiency as well as CO2 retention. He was placed on BiPAP and now his labs seem to be improving. He was also noted to have troponin elevation. He had minimal chest discomfort, however, he denies any chest pain now. He admits of shortness of breath, however, he is not able to talk much. He is able to speak only a few words and whenever asks he nods his head. His EKG revealed nonspecific ST-T changes, but no acute ST-T changes. Hospitalist  services were contacted for admission.   PAST MEDICAL HISTORY:  1. History of emphysema. 2. History of hypertension.  3. History of chronic back pain. 4. History of leg pain. 5. History of falls.  MEDICATIONS: Unknown.   PAST SURGICAL HISTORY: None.   ALLERGIES: No known drug allergies.  FAMILY HISTORY: Hypertension in the patient's mother who died of cervical cancer. No other cancers in the family. No diabetes, strokes, or coronary artery disease.   SOCIAL HISTORY: The patient is married, has four children who live close by.  He used to smoke one pack per day for more than 60 years, quit approximately 8 to 10 years ago. Denies any alcohol abuse, although he used to drink in the past quite a lot. He used to work as a Medical illustratorsalesman and in a mill and was in [employer] for one term.   REVIEW OF SYSTEMS: Not available as the patient is on BiPAP and has significant shortness of breath. He admits to some constipation. He admits of some chest discomfort initially on arrival to the hospital, however, he denies any chest pain now. Admits of having some poor oral intake over the past two weeks. According to the patient's daughter, he has been having some stomach pain. He uses a urinal for urination at nighttime. He uses a walker to ambulate. He admits of having chills as well.   PHYSICAL EXAMINATION:  VITALS:  On arrival to the hospital, his temperature was 99.4, pulse 83, respiratory rate 26, blood pressure 145/76, and saturation 100% on oxygen therapy.   GENERAL: This  is a well-developed, well-nourished male in moderate to severe respiratory distress. He remains dyspneic. He is using BiPAP for oxygenation.   HEENT: Pupils are equally round and reactive to light. Extraocular movements are intact. No icterus or conjunctivitis. Difficulty hearing. No pharyngeal erythema. Mucosa is moist.   NECK: No masses, is supple and nontender. Thyroid is not enlarged. No adenopathy. No JVD or carotid bruits  bilaterally. Full range of motion.   LUNGS: Mildly diminished breath sounds bilaterally. Few wheezes were heard, however, I am almost not able to hear breath sounds. No rhonchi. No wheezing. The patient does have labored inspirations as well as increased effort to breathe. No dullness to percussion. In moderate to severe respiratory distress.   CARDIOVASCULAR: S1 and S2 appreciated. No murmurs, gallops, or rubs were noted. PMI not lateralized. Distant, irregular. Chest is nontender to palpation.   EXTREMITIES: 1+ pedal pulses. No lower extremity edema, calf tenderness, or cyanosis was noted.   ABDOMEN: Soft and nontender. Bowel sounds are present. No hepatosplenomegaly or masses were noted.   RECTAL: Deferred.   MUSCULOSKELETAL: Able to move all extremities. Otherwise, I am not able to get much from him. No cyanosis at this moment. No kyphosis. Gait is not tested.   SKIN: No rashes, lesions, erythema, nodularity, or induration. It was warm and dry to palpation.   LYMPH: No adenopathy in the cervical region.   NEUROLOGICAL: Cranial nerves grossly intact. Sensory is intact. No dysarthria or aphasia. The patient is alert and oriented to time, person, and place. Cooperative. I am not able to assess  memory well. He is in distress at this moment. No further evaluation is possible.   LABS/STUDIES: EKG: Sinus rhythm with premature supraventricular complexes at 87 beats per minute, normal axis, septal infarct age undetermined. No acute ST-T changes. Nonspecific ST-T changes were noted.   X-ray is clear. Chronic obstructive pulmonary disease. According to the radiologist, no acute changes are identified.  LABS/STUDIES: BMP showed BUN and creatinine of 42 and 1.44, glucose 120, and B-type natriuretic peptide was 1320, otherwise unremarkable BMP. The patient's bicarbonate/CO2 level is elevated at 33. Total protein is elevated at 8.5, alkaline phosphatase 151, and AST 81, otherwise liver enzymes are  completely within normal limits. CK total 327, MB fraction 42.5, and troponin 2.1. White blood cell count is elevated to 26.5, hemoglobin 14.2, and platelet count 288.   ABGs were done on 100% FiO2, initially on CPAP likely: pH 7.24, pCO2 77, pO2 552, and saturation 100.4%. Bicarbonate level 33.0.   Repeat ABG: pH 7.26, pCO2 72, AND pO2 265. This was on 50% FiO2 with saturation of 99.8  and bicarbonate level 32.3.   ASSESSMENT AND PLAN:  1. Acute respiratory failure: Admit the patient to the medical floor, Critical Care Unit. Start him on steroids, inhalation therapy, and nebulizers, as well as antibiotic therapy. Get pulmonologist consult for further recommendations.  2. Chronic obstructive pulmonary disease exacerbation: As above start antibiotics as well as inhalers.  3. Acute bronchitis: Continue the patient on antibiotic therapy with Rocephin as well as Zithromax. Get sputum cultures.  4. Non-Q-wave myocardial infarction: Continue beta blockers, metoprolol, aspirin, and nitroglycerin, as well as heparin IV. Check cardiac enzymes x3. Get cardiologist involved as well as get echocardiogram for further recommendations.  5. Chronic renal insufficiency or acute renal failure: Unclear at this point. We will continue the patient on IV fluids and follow the patient's kidney function. We will get urinalysis to rule out urinary tract infection. We will continue  Rocephin if the patient does have pyuria and suspicion for urinary tract infection.  6. Hyperglycemia: Check hemoglobin A1c. 7. Elevated transaminases: Likely due to elevated CK total level. Follow with therapy.  8. Dehydration, as judged by elevated total protein levels as well as renal insufficiency.  9. Elevated white blood cell count, very likely due to stress: Follow with antibiotic therapy.   TIME SPENT: 1 hour, 15 minutes. ____________________________ Katharina Caper, MD rv:slb D: 06/25/2011 10:17:55 ET T: 06/25/2011 11:14:41  ET JOB#: [id number]  cc: Katharina Caper, MD, <Dictator> [facility] Katharin Schneider MD ELECTRONICALLY SIGNED 07/08/2011 20:05
